# Patient Record
Sex: Female | Born: 1995 | Race: White | Hispanic: No | Marital: Single | Smoking: Never smoker
Health system: Southern US, Community
[De-identification: ages and names within clinical notes are randomized; demographics above are authoritative.]

## PROBLEM LIST (undated history)

## (undated) DIAGNOSIS — T7840XA Allergy, unspecified, initial encounter: Secondary | ICD-10-CM

## (undated) DIAGNOSIS — J45909 Unspecified asthma, uncomplicated: Secondary | ICD-10-CM

## (undated) HISTORY — DX: Allergy, unspecified, initial encounter: T78.40XA

## (undated) HISTORY — DX: Unspecified asthma, uncomplicated: J45.909

---
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
## 2014-03-01 ENCOUNTER — Ambulatory Visit: Payer: BC Managed Care – PPO

## 2014-06-06 ENCOUNTER — Encounter: Payer: Self-pay | Admitting: Family Medicine

## 2014-06-06 ENCOUNTER — Ambulatory Visit: Payer: BC Managed Care – PPO | Admitting: Internal Medicine

## 2014-06-06 ENCOUNTER — Ambulatory Visit (INDEPENDENT_AMBULATORY_CARE_PROVIDER_SITE_OTHER): Payer: BC Managed Care – PPO | Admitting: Family Medicine

## 2014-06-06 VITALS — BP 125/81 | HR 92 | Temp 99.0°F | Ht 66.25 in | Wt 178.2 lb

## 2014-06-06 DIAGNOSIS — J029 Acute pharyngitis, unspecified: Secondary | ICD-10-CM

## 2014-06-06 DIAGNOSIS — J069 Acute upper respiratory infection, unspecified: Secondary | ICD-10-CM

## 2014-06-06 LAB — POCT RAPID STREP A (OFFICE): RAPID STREP A SCREEN: NEGATIVE

## 2014-06-06 NOTE — Progress Notes (Addendum)
   Subjective:    Patient ID: Michelle Parker, female    DOB: Aug 19, 1995, 19 y.o.   MRN: [id number]  Sore Throat  This is a new problem. The current episode started today. The problem has been gradually worsening. Neither side of throat is experiencing more pain than the other. There has been no fever. The pain is at a severity of 4/10. The pain is moderate. Associated symptoms include coughing, ear pain, headaches and swollen glands. Pertinent negatives include no abdominal pain, congestion, diarrhea, ear discharge, plugged ear sensation, neck pain, shortness of breath, trouble swallowing or vomiting. Associated symptoms comments: Sore throat , headache and  Left ear ache  dry cough. She has had no exposure to strep or mono. She has tried nothing for the symptoms.  Headache  Associated symptoms include coughing, ear pain and swollen glands. Pertinent negatives include no abdominal pain, neck pain or vomiting.   Non smoker.  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  Review of Systems  HENT: Positive for ear pain. Negative for congestion, ear discharge and trouble swallowing.   Respiratory: Positive for cough. Negative for shortness of breath.   Gastrointestinal: Negative for vomiting, abdominal pain and diarrhea.  Musculoskeletal: Negative for neck pain.  Neurological: Positive for headaches.       Objective:   Physical Exam  Constitutional: Vital signs are normal. She appears well-developed and well-nourished. She is cooperative.  Non-toxic appearance. She does not appear ill. No distress.  HENT:  Head: Normocephalic.  Right Ear: Hearing, tympanic membrane, external ear and ear canal normal. Tympanic membrane is not erythematous, not retracted and not bulging. No middle ear effusion.  Left Ear: Hearing, external ear and ear canal normal. Tympanic membrane is not erythematous, not retracted and not bulging. A middle ear effusion is present.  Nose: Mucosal edema and rhinorrhea  present. Right sinus exhibits no maxillary sinus tenderness and no frontal sinus tenderness. Left sinus exhibits no maxillary sinus tenderness and no frontal sinus tenderness.  Mouth/Throat: Uvula is midline and mucous membranes are normal. Posterior oropharyngeal erythema present. No oropharyngeal exudate or posterior oropharyngeal edema.  Eyes: Conjunctivae, EOM and lids are normal. Pupils are equal, round, and reactive to light. Lids are everted and swept, no foreign bodies found.  Neck: Trachea normal and normal range of motion. Neck supple. Carotid bruit is not present. No thyroid mass and no thyromegaly present.  Cardiovascular: Normal rate, regular rhythm, S1 normal, S2 normal, normal heart sounds, intact distal pulses and normal pulses.  Exam reveals no gallop and no friction rub.   No murmur heard. Pulmonary/Chest: Effort normal and breath sounds normal. No tachypnea. No respiratory distress. She has no decreased breath sounds. She has no wheezes. She has no rhonchi. She has no rales.  Neurological: She is alert.  Skin: Skin is warm, dry and intact. No rash noted.  Psychiatric: Her speech is normal and behavior is normal. Judgment normal. Her mood appears not anxious. Cognition and memory are normal. She does not exhibit a depressed mood.          Assessment & Plan:

## 2014-06-06 NOTE — Progress Notes (Signed)
Pre visit review using our clinic review tool, if applicable. No additional management support is needed unless otherwise documented below in the visit note. 

## 2014-06-06 NOTE — Addendum Note (Signed)
Addended by: Eliezer Lofts E on: 06/06/2014 04:34 PM   Modules accepted: SmartSet

## 2014-06-06 NOTE — Assessment & Plan Note (Signed)
Neg strep, likely viral. Symptomatic care. Reviewed viral timeline and expected course.

## 2014-06-06 NOTE — Patient Instructions (Signed)
Use nasal saline irrigation or spray to clean out sinus.  Start flonase 2 sprays per nostril daily. If cough increases use mucinex DM. Expect 7-10 days ocurse for viral illness, but if worsening at that point as opposed to better, call.

## 2014-06-20 ENCOUNTER — Telehealth: Payer: Self-pay

## 2014-06-20 NOTE — Telephone Encounter (Signed)
According to med list pt has not had Depo injection since 11/2013. Does pt need 2 neg pregnancy test prior to depo injections if she is sexually active? Pt scheduled 06/21/14 for depo injection.Please advise.

## 2014-06-20 NOTE — Telephone Encounter (Signed)
No she does not need two pregnancy tests.  According to [facility] recommendations, only one pregnancy test suggested before administering depo and and suggest back-up contraception (or abstinence) for seven days after injection.

## 2014-06-21 ENCOUNTER — Ambulatory Visit (INDEPENDENT_AMBULATORY_CARE_PROVIDER_SITE_OTHER): Payer: BC Managed Care – PPO

## 2014-06-21 DIAGNOSIS — Z3042 Encounter for surveillance of injectable contraceptive: Secondary | ICD-10-CM

## 2014-06-21 DIAGNOSIS — Z3200 Encounter for pregnancy test, result unknown: Secondary | ICD-10-CM

## 2014-06-21 LAB — POCT URINE PREGNANCY: PREG TEST UR: NEGATIVE

## 2014-06-21 MED ORDER — MEDROXYPROGESTERONE ACETATE 150 MG/ML IM SUSP
150.0000 mg | Freq: Once | INTRAMUSCULAR | Status: AC
  Administered 2014-06-21: 150 mg via INTRAMUSCULAR

## 2014-06-21 NOTE — Telephone Encounter (Signed)
Pt's pregnancy test was negative; LMP over one year ago; Depo injection given and advised pt to [place] back up contraception for 7 days. Pt voiced understanding.

## 2014-09-06 ENCOUNTER — Ambulatory Visit: Payer: BC Managed Care – PPO

## 2014-09-06 ENCOUNTER — Telehealth: Payer: Self-pay | Admitting: Family Medicine

## 2014-09-06 NOTE — Telephone Encounter (Signed)
Patient did not come in for their appointment today for depo shot.  Please let me know if patient needs to be contacted immediately for follow up or no follow up needed.

## 2014-09-07 NOTE — Telephone Encounter (Signed)
pls reschedule and advise her she must receive pregnancy test before receiving injection

## 2014-09-07 NOTE — Telephone Encounter (Signed)
Please call pt to inform her of this.

## 2014-09-07 NOTE — Telephone Encounter (Signed)
L/m for pt to call back to r/s appt  °

## 2014-09-08 ENCOUNTER — Ambulatory Visit: Payer: BC Managed Care – PPO

## 2014-09-08 NOTE — Telephone Encounter (Signed)
L/m for pt to call bac to r/s appt

## 2014-09-12 ENCOUNTER — Ambulatory Visit (INDEPENDENT_AMBULATORY_CARE_PROVIDER_SITE_OTHER): Payer: BC Managed Care – PPO

## 2014-09-12 DIAGNOSIS — Z3042 Encounter for surveillance of injectable contraceptive: Secondary | ICD-10-CM

## 2014-09-12 MED ORDER — MEDROXYPROGESTERONE ACETATE 150 MG/ML IM SUSP
150.0000 mg | Freq: Once | INTRAMUSCULAR | Status: AC
  Administered 2014-09-12: 150 mg via INTRAMUSCULAR

## 2014-11-29 ENCOUNTER — Ambulatory Visit: Payer: BC Managed Care – PPO

## 2014-11-29 ENCOUNTER — Telehealth: Payer: Self-pay | Admitting: Family Medicine

## 2014-11-29 ENCOUNTER — Telehealth: Payer: Self-pay

## 2014-11-29 NOTE — Telephone Encounter (Signed)
Patient did not come in for their appointment today for depo shot.  Please let me know if patient needs to be contacted immediately for follow up or no follow up needed.

## 2014-11-29 NOTE — Telephone Encounter (Signed)
Pt missed nurse visit for depo this morning and left v/m per DPR for pt to cb and schedule nurse visit between 11/29/14 and 12/12/14.

## 2014-11-29 NOTE — Telephone Encounter (Signed)
Yes please contact pt

## 2014-11-29 NOTE — Telephone Encounter (Signed)
Lm for pt to call back to rs appt between 8/3 and 8/16

## 2014-12-04 NOTE — Telephone Encounter (Signed)
Called pt to rs appt, n/a l/m

## 2014-12-07 NOTE — Telephone Encounter (Signed)
Called to ask pt is she wants to come ion early for depo or wait until cpe, she may have to do additional testing

## 2014-12-11 ENCOUNTER — Encounter: Payer: BC Managed Care – PPO | Admitting: Family Medicine

## 2014-12-14 ENCOUNTER — Ambulatory Visit (INDEPENDENT_AMBULATORY_CARE_PROVIDER_SITE_OTHER): Payer: BC Managed Care – PPO | Admitting: Family Medicine

## 2014-12-14 ENCOUNTER — Encounter: Payer: Self-pay | Admitting: *Deleted

## 2014-12-14 ENCOUNTER — Encounter: Payer: Self-pay | Admitting: Family Medicine

## 2014-12-14 VITALS — BP 112/66 | HR 78 | Temp 98.2°F | Ht 66.25 in | Wt 181.8 lb

## 2014-12-14 DIAGNOSIS — Z3042 Encounter for surveillance of injectable contraceptive: Secondary | ICD-10-CM | POA: Diagnosis not present

## 2014-12-14 DIAGNOSIS — D229 Melanocytic nevi, unspecified: Secondary | ICD-10-CM | POA: Insufficient documentation

## 2014-12-14 DIAGNOSIS — Z01419 Encounter for gynecological examination (general) (routine) without abnormal findings: Secondary | ICD-10-CM | POA: Insufficient documentation

## 2014-12-14 DIAGNOSIS — Z0000 Encounter for general adult medical examination without abnormal findings: Secondary | ICD-10-CM | POA: Diagnosis not present

## 2014-12-14 LAB — CBC WITH DIFFERENTIAL/PLATELET
BASOS PCT: 0.5 % (ref 0.0–3.0)
Basophils Absolute: 0 10*3/uL (ref 0.0–0.1)
EOS PCT: 1 % (ref 0.0–5.0)
Eosinophils Absolute: 0.1 10*3/uL (ref 0.0–0.7)
HCT: 40 % (ref 36.0–46.0)
HEMOGLOBIN: 13.9 g/dL (ref 12.0–15.0)
LYMPHS ABS: 1.6 10*3/uL (ref 0.7–4.0)
Lymphocytes Relative: 31.6 % (ref 12.0–46.0)
MCHC: 34.7 g/dL (ref 30.0–36.0)
MCV: 87.7 fl (ref 78.0–100.0)
MONO ABS: 0.3 10*3/uL (ref 0.1–1.0)
Monocytes Relative: 5.1 % (ref 3.0–12.0)
NEUTROS PCT: 61.8 % (ref 43.0–77.0)
Neutro Abs: 3.1 10*3/uL (ref 1.4–7.7)
Platelets: 239 10*3/uL (ref 150.0–400.0)
RBC: 4.57 Mil/uL (ref 3.87–5.11)
RDW: 12.5 % (ref 11.5–14.6)
WBC: 5.1 10*3/uL (ref 4.5–10.5)

## 2014-12-14 LAB — COMPREHENSIVE METABOLIC PANEL
ALBUMIN: 4.6 g/dL (ref 3.5–5.2)
ALT: 13 U/L (ref 0–35)
AST: 13 U/L (ref 0–37)
Alkaline Phosphatase: 69 U/L (ref 39–117)
BUN: 13 mg/dL (ref 6–23)
CHLORIDE: 106 meq/L (ref 96–112)
CO2: 26 mEq/L (ref 19–32)
Calcium: 9.7 mg/dL (ref 8.4–10.5)
Creatinine, Ser: 0.77 mg/dL (ref 0.40–1.20)
GFR: 101.02 mL/min (ref >60.00)
Glucose, Bld: 99 mg/dL (ref 70–99)
POTASSIUM: 4.4 meq/L (ref 3.5–5.1)
SODIUM: 139 meq/L (ref 135–145)
Total Bilirubin: 0.7 mg/dL (ref 0.2–1.2)
Total Protein: 7.4 g/dL (ref 6.0–8.3)

## 2014-12-14 LAB — LIPID PANEL
CHOLESTEROL: 163 mg/dL (ref 0–200)
HDL: 41.5 mg/dL (ref >39.00)
LDL CALC: 104 mg/dL — AB (ref 0–99)
NonHDL: 121.96
Total CHOL/HDL Ratio: 4
Triglycerides: 91 mg/dL (ref 0.0–149.0)
VLDL: 18.2 mg/dL (ref 0.0–40.0)

## 2014-12-14 LAB — TSH: TSH: 3.6 u[IU]/mL (ref 0.35–5.50)

## 2014-12-14 MED ORDER — MEDROXYPROGESTERONE ACETATE 150 MG/ML IM SUSP
150.0000 mg | Freq: Once | INTRAMUSCULAR | Status: AC
  Administered 2014-12-14: 150 mg via INTRAMUSCULAR

## 2014-12-14 NOTE — Addendum Note (Signed)
Addended by: Lucille Passy on: 12/14/2014 07:55 AM   Modules accepted: Miquel Dunn

## 2014-12-14 NOTE — Assessment & Plan Note (Signed)
Reviewed preventive care protocols, scheduled due services, and updated immunizations Discussed nutrition, exercise, diet, and healthy lifestyle.  Orders Placed This Encounter  Procedures  . CBC with Differential/Platelet  . Comprehensive metabolic panel  . Lipid panel  . TSH     

## 2014-12-14 NOTE — Progress Notes (Signed)
Pre visit review using our clinic review tool, if applicable. No additional management support is needed unless otherwise documented below in the visit note. 

## 2014-12-14 NOTE — Assessment & Plan Note (Signed)
Refer to derm  ?

## 2014-12-14 NOTE — Patient Instructions (Signed)
Great to see you Michelle Parker!  Congratulations on your new apartment and upcoming graduation!  We will call you with your lab results.  Please stop by to see Michelle Parker on we can call you with your dermatology appointment

## 2014-12-14 NOTE — Progress Notes (Addendum)
Subjective:    Patient ID: Michelle Parker, female    DOB: [birth year], 19 y.o.   MRN: [id number]  HPI Very pleasant 19 yo female here for CPX.  Contraception- she is receiving q 3 month IM depo.  She's loves it.  Not having periods.  She is sexually active. Just moved in with her boyfriend of 4 years. She is very happy.  Works is going well.    She has received her entire gardasil series.    Pos FH of hypothyroidism- mom and dad.   Lab Results  Component Value Date   TSH 3.19 12/08/2013    Denies depression- feels very happy at work (works for [employer]) and in her relationships with her family and boyfriend.  Lab Results  Component Value Date   CHOL 153 12/08/2013   HDL 39.60 12/08/2013   LDLCALC 101* 12/08/2013   TRIG 62.0 12/08/2013   CHOLHDL 4 12/08/2013    Patient Active Problem List   Diagnosis Date Noted  . Well woman exam 12/14/2014  . Multiple nevi 12/14/2014  . Encounter for routine gynecological examination 12/08/2013  . Menorrhagia 03/05/2012  . Contraceptive management 02/16/2012   Past Medical History  Diagnosis Date  . Asthma   . Allergy    History reviewed. No pertinent past surgical history. Social History  Substance Use Topics  . Smoking status: Never Smoker   . Smokeless tobacco: Never Used  . Alcohol Use: 0.0 oz/week    0 Standard drinks or equivalent per week     Comment: on occassion   Family History  Problem Relation Age of Onset  . Hypothyroidism Mother   . Hypothyroidism Father    No Known Allergies Current Outpatient Prescriptions on File Prior to Visit  Medication Sig Dispense Refill  . medroxyPROGESTERone (DEPO-PROVERA) 150 MG/ML injection Inject 150 mg into the muscle every 3 (three) months.     No current facility-administered medications on file prior to visit.   The PMH, PSH, Social History, Family History, Medications, and allergies have been reviewed in [facility], and have been updated if relevant.   Review of  Systems  Constitutional: Negative.   HENT: Negative.   Eyes: Negative.   Respiratory: Negative.   Cardiovascular: Negative.   Gastrointestinal: Negative.   Endocrine: Negative.   Genitourinary: Negative.   Musculoskeletal: Negative.   Skin: Negative.   Allergic/Immunologic: Negative.   Neurological: Negative.   Hematological: Negative.   Psychiatric/Behavioral: Negative.   All other systems reviewed and are negative.      Objective:   Physical Exam BP 112/66 mmHg  Pulse 78  Temp(Src) 98.2 F (36.8 C) (Oral)  Ht 5' 6.25" (1.683 m)  Wt 181 lb 12 oz (82.441 kg)  BMI 29.11 kg/m2  SpO2 98%  Wt Readings from Last 3 Encounters:  12/14/14 181 lb 12 oz (82.441 kg)  06/06/14 178 lb 4 oz (80.854 kg)  12/08/13 171 lb 8 oz (77.792 kg) (92 %*, Z = 1.43)   * Growth percentiles are based on CDC 2-20 Years data.     General:  Well-developed,well-nourished,in no acute distress; alert,appropriate and cooperative throughout examination Head:  normocephalic and atraumatic.   Eyes:  vision grossly intact, pupils equal, pupils round, and pupils reactive to light.   Ears:  R ear normal and L ear normal.   Nose:  no external deformity.   Mouth:  good dentition.   Neck:  No deformities, masses, or tenderness noted. Breasts:  No mass, nodules, thickening, tenderness, bulging, retraction,  inflamation, nipple discharge or skin changes noted.   Lungs:  Normal respiratory effort, chest expands symmetrically. Lungs are clear to auscultation, no crackles or wheezes. Heart:  Normal rate and regular rhythm. S1 and S2 normal without gallop, murmur, click, rub or other extra sounds. Abdomen:  Bowel sounds positive,abdomen soft and non-tender without masses, organomegaly or hernias noted. Rectal:  no external abnormalities.   Msk:  No deformity or scoliosis noted of thoracic or lumbar spine.   Extremities:  No clubbing, cyanosis, edema, or deformity noted with normal full range of motion of all  joints.   Neurologic:  alert & oriented X3 and gait normal.   Skin:  Intact without suspicious lesions or rashes Multiple nevi Cervical Nodes:  No lymphadenopathy noted Axillary Nodes:  No palpable lymphadenopathy Psych:  Cognition and judgment appear intact. Alert and cooperative with normal attention span and concentration. No apparent delusions, illusions, hallucinations  Assessment & Plan:

## 2014-12-14 NOTE — Assessment & Plan Note (Signed)
IM depo given today. 

## 2014-12-14 NOTE — Addendum Note (Signed)
Addended by: Modena Nunnery on: 12/14/2014 08:04 AM   Modules accepted: Orders

## 2015-03-01 ENCOUNTER — Encounter: Payer: Self-pay | Admitting: Family Medicine

## 2015-03-01 ENCOUNTER — Ambulatory Visit (INDEPENDENT_AMBULATORY_CARE_PROVIDER_SITE_OTHER): Payer: BC Managed Care – PPO | Admitting: Family Medicine

## 2015-03-01 ENCOUNTER — Ambulatory Visit: Payer: BC Managed Care – PPO

## 2015-03-01 VITALS — BP 108/60 | HR 101 | Temp 98.2°F | Wt 182.5 lb

## 2015-03-01 DIAGNOSIS — Z3042 Encounter for surveillance of injectable contraceptive: Secondary | ICD-10-CM

## 2015-03-01 DIAGNOSIS — R51 Headache: Secondary | ICD-10-CM | POA: Diagnosis not present

## 2015-03-01 DIAGNOSIS — Z23 Encounter for immunization: Secondary | ICD-10-CM | POA: Diagnosis not present

## 2015-03-01 DIAGNOSIS — R519 Headache, unspecified: Secondary | ICD-10-CM | POA: Insufficient documentation

## 2015-03-01 DIAGNOSIS — F4323 Adjustment disorder with mixed anxiety and depressed mood: Secondary | ICD-10-CM | POA: Diagnosis not present

## 2015-03-01 MED ORDER — SERTRALINE HCL 25 MG PO TABS: 25.0000 mg | ORAL_TABLET | Freq: Every day | ORAL | Status: DC

## 2015-03-01 MED ORDER — MEDROXYPROGESTERONE ACETATE 150 MG/ML IM SUSP
150.0000 mg | Freq: Once | INTRAMUSCULAR | Status: AC
  Administered 2015-03-01: 150 mg via INTRAMUSCULAR

## 2015-03-01 NOTE — Assessment & Plan Note (Addendum)
New- >25 minutes spent in face to face time with patient, >50% spent in counselling or coordination of care Discussed tx options- eRx sent for zoloft 25 mg daily. She will call in a few weeks with an update.

## 2015-03-01 NOTE — Progress Notes (Signed)
Subjective:   Patient ID: Michelle Parker, female    DOB: 06-08-1995, 19 y.o.   MRN: [id number]  Michelle Parker is a pleasant 19 y.o. year old female who presents to clinic today with Headache and Contraception  on 03/01/2015  HPI:  Kept a headache journal like I advised- noticed that past 3 weeks, she is having frequent headaches.  She thinks it is all stress related. advil and sleep help to relieve her headaches.  Work is very stressful. She "hates going to work."  Feels anxious and tearful "all the time."  Looking for a new job.   Not sleeping well.  Boyfriend is supportive.  No anhedonia. No SI or HI.  Appetite is ok.  Wt Readings from Last 3 Encounters:  03/01/15 182 lb 8 oz (82.781 kg)  12/14/14 181 lb 12 oz (82.441 kg)  06/06/14 178 lb 4 oz (80.854 kg)   Currently receiving q 3 monthly depo.  She is pleased with this.  Sexually active with her boyfriend only.  Current Outpatient Prescriptions on File Prior to Visit  Medication Sig Dispense Refill  . medroxyPROGESTERone (DEPO-PROVERA) 150 MG/ML injection Inject 150 mg into the muscle every 3 (three) months.     No current facility-administered medications on file prior to visit.    No Known Allergies  Past Medical History  Diagnosis Date  . Asthma   . Allergy     No past surgical history on file.  Family History  Problem Relation Age of Onset  . Hypothyroidism Mother   . Hypothyroidism Father     Social History   Social History  . Marital Status: Single    Spouse Name: N/A  . Number of Children: N/A  . Years of Education: N/A   Occupational History  . Not on file.   Social History Main Topics  . Smoking status: Never Smoker   . Smokeless tobacco: Never Used  . Alcohol Use: 0.0 oz/week    0 Standard drinks or equivalent per week     Comment: on occassion  . Drug Use: No  . Sexual Activity: Not on file   Other Topics Concern  . 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   Sexually active with one partner.   The PMH, PSH, Social History, Family History, Medications, and allergies have been reviewed in [facility], and have been updated if relevant.   Review of Systems  Constitutional: Positive for fatigue.  Eyes: Negative.   Respiratory: Negative.   Cardiovascular: Negative.   Gastrointestinal: Negative.   Endocrine: Negative.   Musculoskeletal: Negative.   Skin: Negative.   Allergic/Immunologic: Negative.   Neurological: Positive for headaches. Negative for dizziness, seizures, facial asymmetry, speech difficulty, light-headedness and numbness.  Hematological: Negative.   Psychiatric/Behavioral: Positive for sleep disturbance and dysphoric mood. Negative for suicidal ideas, hallucinations, self-injury and agitation. The patient is nervous/anxious. The patient is not hyperactive.   All other systems reviewed and are negative.      Objective:    BP 108/60 mmHg  Pulse 101  Temp(Src) 98.2 F (36.8 C) (Oral)  Wt 182 lb 8 oz (82.781 kg)  SpO2 98%   Physical Exam  Constitutional: She is oriented to person, place, and time. She appears well-developed and well-nourished. No distress.  HENT:  Head: Normocephalic.  Eyes: Conjunctivae are normal.  Neck: Normal range of motion.  Cardiovascular: Normal rate.   Pulmonary/Chest: Effort normal.  Musculoskeletal: Normal range of motion.  Neurological: She is alert and oriented to person, place, and time. No cranial nerve deficit.  Skin: Skin is warm and dry.  Psychiatric: She has a normal mood and affect. Her behavior is normal. Judgment and thought content normal.  Nursing note and vitals reviewed.         Assessment & Plan:   Need for influenza vaccination - Plan: Flu Vaccine QUAD 36+ mos PF IM (Fluarix & Fluzone Quad PF)  Encounter for surveillance of injectable contraceptive - Plan: medroxyPROGESTERone (DEPO-PROVERA) injection 150 mg  Frequent headaches  Adjustment disorder with  mixed anxiety and depressed mood No Follow-up on file.

## 2015-03-01 NOTE — Assessment & Plan Note (Signed)
IM depo given today. 

## 2015-03-01 NOTE — Patient Instructions (Signed)
Great to see you.  We are starting zoloft 25 mg daily. Please call with an update in a few weeks.

## 2015-03-01 NOTE — Assessment & Plan Note (Signed)
Deteriorated. Likely due to acute stressors. Follow up in 1 month. See above. Continue NSAIDs prn for abortive therapy.

## 2015-03-01 NOTE — Progress Notes (Signed)
Pre visit review using our clinic review tool, if applicable. No additional management support is needed unless otherwise documented below in the visit note. 

## 2015-06-27 ENCOUNTER — Ambulatory Visit (INDEPENDENT_AMBULATORY_CARE_PROVIDER_SITE_OTHER): Payer: BC Managed Care – PPO | Admitting: *Deleted

## 2015-06-27 ENCOUNTER — Ambulatory Visit (INDEPENDENT_AMBULATORY_CARE_PROVIDER_SITE_OTHER): Payer: BC Managed Care – PPO | Admitting: Internal Medicine

## 2015-06-27 ENCOUNTER — Encounter: Payer: Self-pay | Admitting: Internal Medicine

## 2015-06-27 VITALS — BP 106/80 | HR 110 | Temp 99.3°F | Wt 195.0 lb

## 2015-06-27 DIAGNOSIS — Z308 Encounter for other contraceptive management: Secondary | ICD-10-CM

## 2015-06-27 DIAGNOSIS — R52 Pain, unspecified: Secondary | ICD-10-CM | POA: Diagnosis not present

## 2015-06-27 DIAGNOSIS — J111 Influenza due to unidentified influenza virus with other respiratory manifestations: Secondary | ICD-10-CM | POA: Diagnosis not present

## 2015-06-27 DIAGNOSIS — R509 Fever, unspecified: Secondary | ICD-10-CM

## 2015-06-27 LAB — POCT INFLUENZA A/B
INFLUENZA A, POC: NEGATIVE
Influenza B, POC: POSITIVE — AB

## 2015-06-27 LAB — POCT URINE PREGNANCY: PREG TEST UR: NEGATIVE

## 2015-06-27 LAB — POCT RAPID STREP A (OFFICE): Rapid Strep A Screen: NEGATIVE

## 2015-06-27 MED ORDER — OSELTAMIVIR PHOSPHATE 75 MG PO CAPS: 75.0000 mg | ORAL_CAPSULE | Freq: Two times a day (BID) | ORAL | Status: DC

## 2015-06-27 MED ORDER — MEDROXYPROGESTERONE ACETATE 150 MG/ML IM SUSP
150.0000 mg | Freq: Once | INTRAMUSCULAR | Status: AC
  Administered 2015-06-27: 150 mg via INTRAMUSCULAR

## 2015-06-27 NOTE — Patient Instructions (Addendum)

## 2015-06-27 NOTE — Progress Notes (Signed)
HPI  Pt presents to the clinic today with c/o ear pain and sore throat. This started 2 days ago. She denies loss of hearing. She is having some difficulty swallowing, runny nose, nasal congestion and cough. She reports fever and body aches. She denies shortness of breath. She has taken Tylenol with minimal relief. She has a history of allergies and breathing problems. She has had sick contacts. She did get her flu shot.  Review of Systems    Past Medical History  Diagnosis Date  . Asthma   . Allergy     Family History  Problem Relation Age of Onset  . Hypothyroidism Mother   . Hypothyroidism Father     Social History   Social History  . Marital Status: Single    Spouse Name: N/A  . Number of Children: N/A  . Years of Education: N/A   Occupational History  . Not on file.   Social History Main Topics  . Smoking status: Never Smoker   . Smokeless tobacco: Never Used  . Alcohol Use: 0.0 oz/week    0 Standard drinks or equivalent per week     Comment: on occassion  . Drug Use: No  . Sexual Activity: Not on file   Other Topics Concern  . Not on file   Social History Narrative   Works for [employer] active with one partner.    No Known Allergies   Constitutional: Positive headache, fatigue and fever. Denies abrupt weight changes.  HEENT:  Positive ear pain, nasal congestion, runny nose and sore throat. Denies eye redness, ringing in the ears, wax buildup or bloody nose. Respiratory: Positive cough. Denies difficulty breathing or shortness of breath.  Cardiovascular: Denies chest pain, chest tightness, palpitations or swelling in the hands or feet.   No other specific complaints in a complete review of systems (except as listed in HPI above).  Objective:  BP 106/80 mmHg  Pulse 110  Temp(Src) 99.3 F (37.4 C) (Oral)  Wt 195 lb (88.451 kg)  SpO2 98%   General: Appears her stated age, ill appearing in NAD. HEENT: Head: normal shape and size,  no sinus tenderness noted; Eyes: sclera white, no icterus, conjunctiva pink; Ears: Tm's pink but intact, normal light reflex, + serous effusion on the right; Nose: mucosa boggy and moist, septum midline; Throat/Mouth: Teeth present, mucosa erythematous and moist, no exudate noted, no lesions or ulcerations noted.  Neck:  No adenopathy noted.  Cardiovascular: Tachycardic with normal rhythm. S1,S2 noted.  No murmur, rubs or gallops noted.  Pulmonary/Chest: Normal effort and positive vesicular breath sounds. No respiratory distress. No wheezes, rales or ronchi noted.      Assessment & Plan:  Influenza:  Rapid flu: positive RST: negative Ibuprofen as needed for fever/inflammation Salt water gargles for sore throat Flonase BID x 3 days then daily thereafter Mucinex Q12H as needed for congestion Delsym as needed for cough eRx for Tamiflu 75 mg BID x 5 days  RTC as needed or if symptoms persist.

## 2015-06-27 NOTE — Addendum Note (Signed)
Addended by: Lurlean Nanny on: 06/27/2015 03:20 PM   Modules accepted: Orders

## 2015-08-28 ENCOUNTER — Ambulatory Visit (INDEPENDENT_AMBULATORY_CARE_PROVIDER_SITE_OTHER): Payer: BC Managed Care – PPO | Admitting: Internal Medicine

## 2015-08-28 ENCOUNTER — Encounter: Payer: Self-pay | Admitting: Internal Medicine

## 2015-08-28 VITALS — BP 114/70 | HR 78 | Temp 99.2°F | Wt 198.8 lb

## 2015-08-28 DIAGNOSIS — J309 Allergic rhinitis, unspecified: Secondary | ICD-10-CM

## 2015-08-28 DIAGNOSIS — R059 Cough, unspecified: Secondary | ICD-10-CM

## 2015-08-28 DIAGNOSIS — R05 Cough: Secondary | ICD-10-CM | POA: Diagnosis not present

## 2015-08-28 MED ORDER — HYDROCODONE-HOMATROPINE 5-1.5 MG/5ML PO SYRP: 5.0000 mL | ORAL_SOLUTION | Freq: Three times a day (TID) | ORAL | Status: DC | PRN

## 2015-08-28 MED ORDER — METHYLPREDNISOLONE ACETATE 80 MG/ML IJ SUSP
80.0000 mg | Freq: Once | INTRAMUSCULAR | Status: AC
  Administered 2015-08-28: 80 mg via INTRAMUSCULAR

## 2015-08-28 NOTE — Patient Instructions (Signed)
Allergic Rhinitis Allergic rhinitis is when the mucous membranes in the nose respond to allergens. Allergens are particles in the air that cause your body to have an allergic reaction. This causes you to release allergic antibodies. Through a chain of events, these eventually cause you to release histamine into the blood stream. Although meant to protect the body, it is this release of histamine that causes your discomfort, such as frequent sneezing, congestion, and an itchy, runny nose.  CAUSES Seasonal allergic rhinitis (hay fever) is caused by pollen allergens that may come from grasses, trees, and weeds. Year-round allergic rhinitis (perennial allergic rhinitis) is caused by allergens such as house dust mites, pet dander, and mold spores. SYMPTOMS  Nasal stuffiness (congestion).  Itchy, runny nose with sneezing and tearing of the eyes. DIAGNOSIS Your health care provider can help you determine the allergen or allergens that trigger your symptoms. If you and your health care provider are unable to determine the allergen, skin or blood testing may be used. Your health care provider will diagnose your condition after taking your health history and performing a physical exam. Your health care provider may assess you for other related conditions, such as asthma, pink eye, or an ear infection. TREATMENT Allergic rhinitis does not have a cure, but it can be controlled by:  Medicines that block allergy symptoms. These may include allergy shots, nasal sprays, and oral antihistamines.  Avoiding the allergen. Hay fever may often be treated with antihistamines in pill or nasal spray forms. Antihistamines block the effects of histamine. There are over-the-counter medicines that may help with nasal congestion and swelling around the eyes. Check with your health care provider before taking or giving this medicine. If avoiding the allergen or the medicine prescribed do not work, there are many new medicines  your health care provider can prescribe. Stronger medicine may be used if initial measures are ineffective. Desensitizing injections can be used if medicine and avoidance does not work. Desensitization is when a patient is given ongoing shots until the body becomes less sensitive to the allergen. Make sure you follow up with your health care provider if problems continue. HOME CARE INSTRUCTIONS It is not possible to completely avoid allergens, but you can reduce your symptoms by taking steps to limit your exposure to them. It helps to know exactly what you are allergic to so that you can avoid your specific triggers. SEEK MEDICAL CARE IF:  You have a fever.  You develop a cough that does not stop easily (persistent).  You have shortness of breath.  You start wheezing.  Symptoms interfere with normal daily activities.   This information is not intended to replace advice given to you by your health care provider. Make sure you discuss any questions you have with your health care provider.   Document Released: 01/07/2001 Document Revised: 05/05/2014 Document Reviewed: 12/20/2012 Elsevier Interactive Patient Education 2016 [employer]  

## 2015-08-28 NOTE — Progress Notes (Signed)
Pre visit review using our clinic review tool, if applicable. No additional management support is needed unless otherwise documented below in the visit note. 

## 2015-08-28 NOTE — Progress Notes (Signed)
HPI  Pt presents to the clinic today with c/o sore throat, cough and fatigue. This started 1 week ago. She is not blowing anything out of her nose. The cough is productive of  Green/brown mucous only in the morning. She denies fevers, chills or body ache. She has tried Human resources officer without relief. She does not have a h/o seasonal allergies, but she moved into a house 3 wks ago which has mold. She has not had sick contact.  Review of Systems    Past Medical History  Diagnosis Date  . Asthma   . Allergy     Family History  Problem Relation Age of Onset  . Hypothyroidism Mother   . Hypothyroidism Father     Social History   Social History  . Marital Status: Single    Spouse Name: N/A  . Number of Children: N/A  . Years of Education: N/A   Occupational History  . Not on file.   Social History Main Topics  . Smoking status: Never Smoker   . Smokeless tobacco: Never Used  . Alcohol Use: 0.0 oz/week    0 Standard drinks or equivalent per week     Comment: on occassion  . Drug Use: No  . Sexual Activity: Not on file   Other Topics Concern  . Not on file   Social History Narrative   Works for [employer] active with one partner.    No Known Allergies   Constitutional: Positive for fatigue. Denies headache, fever, and abrupt weight changes.  HEENT:  Positive for sore throat and occasional ear pain/itchiness. Denies eye pain, facial pain, and nasal congestion. Denies eye redness, ringing in the ears, wax buildup, runny nose or bloody nose. Respiratory: Positive productive cough. Denies difficulty breathing or shortness of breath.  Cardiovascular: Denies chest pain, chest tightness, palpitations or swelling in the hands or feet.   No other specific complaints in a complete review of systems (except as listed in HPI above).  Objective:  BP 114/70 mmHg  Pulse 78  Temp(Src) 99.2 F (37.3 C) (Oral)  Wt 198 lb 12 oz (90.152 kg)  SpO2 99%   General:  Appears her stated age, in NAD. HEENT: Head: normal shape and size, no sinus tenderness noted; Eyes: sclera white, no icterus, conjunctiva pink; Ears: Tm's gray and intact, normal light reflex; Nose: mucosa boggy and moist, erythematous and inflamed (left>right) septum midline; Throat/Mouth: + PND. Teeth present, mucosa erythematous and moist, no exudate noted, no lesions or ulcerations noted.  Neck:  No adenopathy noted.  Cardiovascular: Normal rate and rhythm. S1,S2 noted.  No murmur, rubs or gallops noted.  Pulmonary/Chest: Normal effort and positive vesicular breath sounds. No respiratory distress. No wheezes, rales or ronchi noted.      Assessment & Plan:   Allergic rhinitis:  80 mg Depo IM today Continue Allegra Start Flonase  RX for Hycodan for cough   RTC as needed or if symptoms persist.

## 2015-11-02 ENCOUNTER — Ambulatory Visit: Payer: BC Managed Care – PPO

## 2015-12-27 ENCOUNTER — Ambulatory Visit (INDEPENDENT_AMBULATORY_CARE_PROVIDER_SITE_OTHER): Payer: BC Managed Care – PPO | Admitting: Family Medicine

## 2015-12-27 ENCOUNTER — Encounter: Payer: Self-pay | Admitting: Family Medicine

## 2015-12-27 ENCOUNTER — Other Ambulatory Visit (HOSPITAL_COMMUNITY)
Admission: RE | Admit: 2015-12-27 | Discharge: 2015-12-27 | Disposition: A | Discharge status: Home / Self Care | Payer: BC Managed Care – PPO | Source: Ambulatory Visit | Attending: Family Medicine | Admitting: Family Medicine

## 2015-12-27 VITALS — BP 114/66 | HR 93 | Temp 98.3°F | Ht 66.25 in | Wt 200.2 lb

## 2015-12-27 DIAGNOSIS — Z01419 Encounter for gynecological examination (general) (routine) without abnormal findings: Secondary | ICD-10-CM | POA: Insufficient documentation

## 2015-12-27 DIAGNOSIS — Z0000 Encounter for general adult medical examination without abnormal findings: Secondary | ICD-10-CM | POA: Diagnosis not present

## 2015-12-27 DIAGNOSIS — Z3042 Encounter for surveillance of injectable contraceptive: Secondary | ICD-10-CM

## 2015-12-27 DIAGNOSIS — Z1151 Encounter for screening for human papillomavirus (HPV): Secondary | ICD-10-CM | POA: Diagnosis not present

## 2015-12-27 DIAGNOSIS — Z23 Encounter for immunization: Secondary | ICD-10-CM | POA: Diagnosis not present

## 2015-12-27 LAB — LIPID PANEL
CHOL/HDL RATIO: 4
CHOLESTEROL: 160 mg/dL (ref 0–200)
HDL: 37.5 mg/dL — ABNORMAL LOW (ref >39.00)
LDL CALC: 102 mg/dL — AB (ref 0–99)
NonHDL: 122.13
Triglycerides: 103 mg/dL (ref 0.0–149.0)
VLDL: 20.6 mg/dL (ref 0.0–40.0)

## 2015-12-27 LAB — TSH: TSH: 2.81 u[IU]/mL (ref 0.35–4.50)

## 2015-12-27 LAB — CBC WITH DIFFERENTIAL/PLATELET
BASOS PCT: 0.4 % (ref 0.0–3.0)
Basophils Absolute: 0 10*3/uL (ref 0.0–0.1)
EOS ABS: 0.2 10*3/uL (ref 0.0–0.7)
EOS PCT: 3 % (ref 0.0–5.0)
HCT: 39.4 % (ref 36.0–46.0)
Hemoglobin: 13.8 g/dL (ref 12.0–15.0)
LYMPHS ABS: 1.7 10*3/uL (ref 0.7–4.0)
Lymphocytes Relative: 25.8 % (ref 12.0–46.0)
MCHC: 35.1 g/dL (ref 30.0–36.0)
MCV: 87.1 fl (ref 78.0–100.0)
MONO ABS: 0.4 10*3/uL (ref 0.1–1.0)
Monocytes Relative: 6.3 % (ref 3.0–12.0)
NEUTROS ABS: 4.3 10*3/uL (ref 1.4–7.7)
NEUTROS PCT: 64.5 % (ref 43.0–77.0)
PLATELETS: 240 10*3/uL (ref 150.0–400.0)
RBC: 4.53 Mil/uL (ref 3.87–5.11)
RDW: 12.5 % (ref 11.5–15.5)
WBC: 6.7 10*3/uL (ref 4.0–10.5)

## 2015-12-27 LAB — COMPREHENSIVE METABOLIC PANEL
ALBUMIN: 4.4 g/dL (ref 3.5–5.2)
ALT: 35 U/L (ref 0–35)
AST: 70 U/L — ABNORMAL HIGH (ref 0–37)
Alkaline Phosphatase: 74 U/L (ref 39–117)
BUN: 14 mg/dL (ref 6–23)
CHLORIDE: 105 meq/L (ref 96–112)
CO2: 26 meq/L (ref 19–32)
Calcium: 9.1 mg/dL (ref 8.4–10.5)
Creatinine, Ser: 0.72 mg/dL (ref 0.40–1.20)
GFR: 108.08 mL/min (ref >60.00)
Glucose, Bld: 91 mg/dL (ref 70–99)
POTASSIUM: 4.4 meq/L (ref 3.5–5.1)
SODIUM: 138 meq/L (ref 135–145)
Total Bilirubin: 0.6 mg/dL (ref 0.2–1.2)
Total Protein: 7.4 g/dL (ref 6.0–8.3)

## 2015-12-27 MED ORDER — NORETHIN ACE-ETH ESTRAD-FE 1-20 MG-MCG PO TABS: 1.0000 | ORAL_TABLET | Freq: Every day | ORAL | 11 refills | Status: DC

## 2015-12-27 NOTE — Patient Instructions (Signed)
Great to see you. We will call you with your results from today and you can view them online. 

## 2015-12-27 NOTE — Progress Notes (Signed)
Pre visit review using our clinic review tool, if applicable. No additional management support is needed unless otherwise documented below in the visit note. 

## 2015-12-27 NOTE — Assessment & Plan Note (Signed)
Education given regarding options for contraception, including oral contraceptives   eRx sent for Loestrin.

## 2015-12-27 NOTE — Assessment & Plan Note (Signed)
Reviewed preventive care protocols, scheduled due services, and updated immunizations Discussed nutrition, exercise, diet, and healthy lifestyle.  Pap smear done today.  Orders Placed This Encounter  Procedures  . Flu Vaccine QUAD 36+ mos PF IM (Fluarix & Fluzone Quad PF)  . CBC with Differential/Platelet  . Comprehensive metabolic panel  . Lipid panel  . TSH

## 2015-12-27 NOTE — Progress Notes (Signed)
Subjective:   Patient ID: Michelle Parker, female    DOB: Nov 28, 1995, 20 y.o.   MRN: [id number]  Michelle Parker is a pleasant 20 y.o. year old female who presents to clinic today with Annual Exam (with pap)  on 12/27/2015  HPI:  Has never had a pap smear.  No family history of cervical cancer.  Sexually active with her boyfriend only.  Currently receiving Depo provera every 3 months for birth control.  She wants to go back to [facility] since it is hard to come for an injection every 3 months.  Also she is concerned about decreased sex drive with it.  Wt Readings from Last 3 Encounters:  12/27/15 200 lb 4 oz (90.8 kg)  08/28/15 198 lb 12 oz (90.2 kg)  06/27/15 195 lb (88.5 kg)   Current Outpatient Prescriptions on File Prior to Visit  Medication Sig Dispense Refill  . medroxyPROGESTERone (DEPO-PROVERA) 150 MG/ML injection Inject 150 mg into the muscle every 3 (three) months.     No current facility-administered medications on file prior to visit.     No Known Allergies  Past Medical History:  Diagnosis Date  . Allergy   . Asthma     No past surgical history on file.  Family History  Problem Relation Age of Onset  . Hypothyroidism Mother   . Hypothyroidism Father     Social History   Social History  . Marital status: Single    Spouse name: N/A  . Number of children: N/A  . Years of education: N/A   Occupational History  . Not on file.   Social History Main Topics  . Smoking status: Never Smoker  . Smokeless tobacco: Never Used  . Alcohol use 0.0 oz/week     Comment: on occassion  . Drug use: No  . Sexual activity: Not on file   Other Topics Concern  . Not on file   Social History Narrative   Works for [employer] active with one partner.   The PMH, PSH, Social History, Family History, Medications, and allergies have been reviewed in [facility], and have been updated if relevant.   Review of Systems  Constitutional: Negative.   HENT:  Negative.   Respiratory: Negative.   Cardiovascular: Negative.   Gastrointestinal: Negative.   Endocrine: Negative.   Genitourinary: Negative.   Musculoskeletal: Negative.   Allergic/Immunologic: Negative.   Neurological: Negative.   Hematological: Negative.   Psychiatric/Behavioral: Negative.   All other systems reviewed and are negative.      Objective:    BP 114/66   Pulse 93   Temp 98.3 F (36.8 C) (Oral)   Ht 5' 6.25" (1.683 m)   Wt 200 lb 4 oz (90.8 kg)   SpO2 99%   BMI 32.08 kg/m    Physical Exam    General:  Well-developed,well-nourished,in no acute distress; alert,appropriate and cooperative throughout examination Head:  normocephalic and atraumatic.   Eyes:  vision grossly intact, pupils equal, pupils round, and pupils reactive to light.   Ears:  R ear normal and L ear normal.   Nose:  no external deformity.   Mouth:  good dentition.   Neck:  No deformities, masses, or tenderness noted. Breasts:  No mass, nodules, thickening, tenderness, bulging, retraction, inflamation, nipple discharge or skin changes noted.   Lungs:  Normal respiratory effort, chest expands symmetrically. Lungs are clear to auscultation, no crackles or wheezes. Heart:  Normal rate and regular rhythm. S1 and S2 normal  without gallop, murmur, click, rub or other extra sounds. Abdomen:  Bowel sounds positive,abdomen soft and non-tender without masses, organomegaly or hernias noted. Rectal:  no external abnormalities.   Genitalia:  Pelvic Exam:        External: normal female genitalia without lesions or masses        Vagina: normal without lesions or masses        Cervix: normal without lesions or masses        Adnexa: normal bimanual exam without masses or fullness        Uterus: normal by palpation        Pap smear: performed Msk:  No deformity or scoliosis noted of thoracic or lumbar spine.   Extremities:  No clubbing, cyanosis, edema, or deformity noted with normal full range of motion  of all joints.   Neurologic:  alert & oriented X3 and gait normal.   Skin:  Intact without suspicious lesions or rashes Cervical Nodes:  No lymphadenopathy noted Axillary Nodes:  No palpable lymphadenopathy Psych:  Cognition and judgment appear intact. Alert and cooperative with normal attention span and concentration. No apparent delusions, illusions, hallucinations      Assessment & Plan:   Need for influenza vaccination - Plan: Flu Vaccine QUAD 36+ mos PF IM (Fluarix & Fluzone Quad PF)  Encounter for surveillance of injectable contraceptive  Well woman exam No Follow-up on file.

## 2015-12-27 NOTE — Addendum Note (Signed)
Addended by: Modena Nunnery on: 12/27/2015 08:02 AM   Modules accepted: Orders

## 2015-12-28 ENCOUNTER — Encounter: Payer: Self-pay | Admitting: *Deleted

## 2015-12-28 LAB — CYTOLOGY - PAP

## 2016-01-02 ENCOUNTER — Encounter: Payer: Self-pay | Admitting: *Deleted

## 2016-01-10 ENCOUNTER — Telehealth: Payer: Self-pay

## 2016-01-10 NOTE — Telephone Encounter (Signed)
Pt request BC transferred to pharmacy in [place]. pthas moved. Spoke with Ronalee Belts at [facility] and rx transferred on 01/02/16 to [place]. Pt will ck with [facility].

## 2016-02-04 ENCOUNTER — Other Ambulatory Visit: Payer: Self-pay | Admitting: Family Medicine

## 2016-12-31 ENCOUNTER — Ambulatory Visit (INDEPENDENT_AMBULATORY_CARE_PROVIDER_SITE_OTHER): Payer: 59 | Admitting: Family Medicine

## 2016-12-31 ENCOUNTER — Encounter: Payer: Self-pay | Admitting: Family Medicine

## 2016-12-31 ENCOUNTER — Other Ambulatory Visit (HOSPITAL_COMMUNITY)
Admission: RE | Admit: 2016-12-31 | Discharge: 2016-12-31 | Disposition: A | Discharge status: Home / Self Care | Payer: 59 | Source: Ambulatory Visit | Attending: Family Medicine | Admitting: Family Medicine

## 2016-12-31 VITALS — BP 104/82 | HR 82 | Temp 98.4°F | Resp 16 | Ht 66.25 in | Wt 189.8 lb

## 2016-12-31 DIAGNOSIS — Z124 Encounter for screening for malignant neoplasm of cervix: Secondary | ICD-10-CM

## 2016-12-31 DIAGNOSIS — Z3041 Encounter for surveillance of contraceptive pills: Secondary | ICD-10-CM | POA: Diagnosis not present

## 2016-12-31 DIAGNOSIS — Z01419 Encounter for gynecological examination (general) (routine) without abnormal findings: Secondary | ICD-10-CM

## 2016-12-31 DIAGNOSIS — Z23 Encounter for immunization: Secondary | ICD-10-CM

## 2016-12-31 DIAGNOSIS — Z0000 Encounter for general adult medical examination without abnormal findings: Secondary | ICD-10-CM | POA: Diagnosis not present

## 2016-12-31 LAB — COMPREHENSIVE METABOLIC PANEL
ALK PHOS: 75 U/L (ref 39–117)
ALT: 13 U/L (ref 0–35)
AST: 12 U/L (ref 0–37)
Albumin: 4.8 g/dL (ref 3.5–5.2)
BILIRUBIN TOTAL: 0.7 mg/dL (ref 0.2–1.2)
BUN: 8 mg/dL (ref 6–23)
CO2: 26 meq/L (ref 19–32)
CREATININE: 0.71 mg/dL (ref 0.40–1.20)
Calcium: 10 mg/dL (ref 8.4–10.5)
Chloride: 102 mEq/L (ref 96–112)
GFR: 108.82 mL/min (ref >60.00)
GLUCOSE: 83 mg/dL (ref 70–99)
Potassium: 4.1 mEq/L (ref 3.5–5.1)
SODIUM: 138 meq/L (ref 135–145)
TOTAL PROTEIN: 7.7 g/dL (ref 6.0–8.3)

## 2016-12-31 LAB — LIPID PANEL
Cholesterol: 203 mg/dL — ABNORMAL HIGH (ref 0–200)
HDL: 44.1 mg/dL (ref >39.00)
LDL Cholesterol: 141 mg/dL — ABNORMAL HIGH (ref 0–99)
NONHDL: 159.2
TRIGLYCERIDES: 92 mg/dL (ref 0.0–149.0)
Total CHOL/HDL Ratio: 5
VLDL: 18.4 mg/dL (ref 0.0–40.0)

## 2016-12-31 LAB — CBC WITH DIFFERENTIAL/PLATELET
BASOS ABS: 0 10*3/uL (ref 0.0–0.1)
Basophils Relative: 0.8 % (ref 0.0–3.0)
EOS ABS: 0 10*3/uL (ref 0.0–0.7)
Eosinophils Relative: 0.5 % (ref 0.0–5.0)
HCT: 41 % (ref 36.0–46.0)
Hemoglobin: 13.9 g/dL (ref 12.0–15.0)
LYMPHS ABS: 1.9 10*3/uL (ref 0.7–4.0)
Lymphocytes Relative: 32.2 % (ref 12.0–46.0)
MCHC: 33.8 g/dL (ref 30.0–36.0)
MCV: 88.7 fl (ref 78.0–100.0)
MONOS PCT: 6.4 % (ref 3.0–12.0)
Monocytes Absolute: 0.4 10*3/uL (ref 0.1–1.0)
NEUTROS ABS: 3.5 10*3/uL (ref 1.4–7.7)
NEUTROS PCT: 60.1 % (ref 43.0–77.0)
PLATELETS: 278 10*3/uL (ref 150.0–400.0)
RBC: 4.62 Mil/uL (ref 3.87–5.11)
RDW: 12.3 % (ref 11.5–15.5)
WBC: 5.8 10*3/uL (ref 4.0–10.5)

## 2016-12-31 LAB — TSH: TSH: 2.42 u[IU]/mL (ref 0.35–4.50)

## 2016-12-31 MED ORDER — DROSPIRENONE-ETHINYL ESTRADIOL 3-0.02 MG PO TABS: 1.0000 | ORAL_TABLET | Freq: Every day | ORAL | 11 refills | Status: AC

## 2016-12-31 NOTE — Progress Notes (Signed)
Subjective:   Patient ID: Michelle Parker, female    DOB: 09/08/1995, 21 y.o.   MRN: [id number]  Michelle Parker is a pleasant 21 y.o. year old female who presents to clinic today with Annual Exam  on 12/31/2016  HPI:  First pap smear done by me on 12/27/15- normal.  No family history of cervical cancer.  Sexually active with her husband only. Not pleased with Loestrin- periods are heavy and cramping severe. Lab Results  Component Value Date   CHOL 203 (H) 12/31/2016   HDL 44.10 12/31/2016   LDLCALC 141 (H) 12/31/2016   TRIG 92.0 12/31/2016   CHOLHDL 5 12/31/2016   Lab Results  Component Value Date   CREATININE 0.71 12/31/2016   Lab Results  Component Value Date   NA 138 12/31/2016   K 4.1 12/31/2016   CL 102 12/31/2016   CO2 26 12/31/2016   Lab Results  Component Value Date   WBC 5.8 12/31/2016   HGB 13.9 12/31/2016   HCT 41.0 12/31/2016   MCV 88.7 12/31/2016   PLT 278.0 12/31/2016   Lab Results  Component Value Date   TSH 2.42 12/31/2016    Wt Readings from Last 3 Encounters:  12/31/16 189 lb 12.8 oz (86.1 kg)  12/27/15 200 lb 4 oz (90.8 kg)  08/28/15 198 lb 12 oz (90.2 kg)   No current outpatient prescriptions on file prior to visit.   No current facility-administered medications on file prior to visit.     No Known Allergies  Past Medical History:  Diagnosis Date  . Allergy   . Asthma     History reviewed. No pertinent surgical history.  Family History  Problem Relation Age of Onset  . Hypothyroidism Mother   . Hypothyroidism Father     Social History   Social History  . Marital status: Single    Spouse name: N/A  . Number of children: N/A  . Years of education: N/A   Occupational History  . Not on file.   Social History Main Topics  . Smoking status: Never Smoker  . Smokeless tobacco: Never Used  . Alcohol use 0.0 oz/week     Comment: on occassion  . Drug use: No  . Sexual activity: Not on file   Other Topics Concern  . Not  on file   Social History Narrative   Works for [employer] active with one partner.   The PMH, PSH, Social History, Family History, Medications, and allergies have been reviewed in [facility], and have been updated if relevant.   Review of Systems  Constitutional: Negative.   HENT: Negative.   Respiratory: Negative.   Cardiovascular: Negative.   Gastrointestinal: Negative.   Endocrine: Negative.   Genitourinary: Negative.   Musculoskeletal: Negative.   Allergic/Immunologic: Negative.   Neurological: Negative.   Hematological: Negative.   Psychiatric/Behavioral: Negative.   All other systems reviewed and are negative.      Objective:    BP 104/82   Pulse 82   Temp 98.4 F (36.9 C) (Oral)   Resp 16   Ht 5' 6.25" (1.683 m)   Wt 189 lb 12.8 oz (86.1 kg)   LMP 12/15/2016   SpO2 98%   BMI 30.40 kg/m    Physical Exam    General:  Well-developed,well-nourished,in no acute distress; alert,appropriate and cooperative throughout examination Head:  normocephalic and atraumatic.   Eyes:  vision grossly intact, pupils equal, pupils round, and pupils reactive to light.  Ears:  R ear normal and L ear normal.   Nose:  no external deformity.   Mouth:  good dentition.   Neck:  No deformities, masses, or tenderness noted. Breasts:  No mass, nodules, thickening, tenderness, bulging, retraction, inflamation, nipple discharge or skin changes noted.   Lungs:  Normal respiratory effort, chest expands symmetrically. Lungs are clear to auscultation, no crackles or wheezes. Heart:  Normal rate and regular rhythm. S1 and S2 normal without gallop, murmur, click, rub or other extra sounds. Abdomen:  Bowel sounds positive,abdomen soft and non-tender without masses, organomegaly or hernias noted. Rectal:  no external abnormalities.   Genitalia:  Pelvic Exam:        External: normal female genitalia without lesions or masses        Vagina: normal without lesions or masses         Cervix: normal without lesions or masses        Adnexa: normal bimanual exam without masses or fullness        Uterus: normal by palpation        Pap smear: performed Msk:  No deformity or scoliosis noted of thoracic or lumbar spine.   Extremities:  No clubbing, cyanosis, edema, or deformity noted with normal full range of motion of all joints.   Neurologic:  alert & oriented X3 and gait normal.   Skin:  Intact without suspicious lesions or rashes Cervical Nodes:  No lymphadenopathy noted Axillary Nodes:  No palpable lymphadenopathy Psych:  Cognition and judgment appear intact. Alert and cooperative with normal attention span and concentration. No apparent delusions, illusions, hallucinations      Assessment & Plan:   Well woman exam - Plan: CBC with Differential/Platelet, Comprehensive metabolic panel, Lipid panel, TSH  Encounter for surveillance of contraceptive pills  Need for immunization against influenza - Plan: Flu Vaccine QUAD 6+ mos PF IM (Fluarix Quad PF)  Cervical cancer screening - Plan: Cytology - PAP No Follow-up on file.

## 2017-01-01 NOTE — Assessment & Plan Note (Signed)
Having spotting and cramping with current ocps. D/c loestrin. eRx sent for yaz. Call or return to clinic prn if these symptoms worsen or fail to improve as anticipated. The patient indicates understanding of these issues and agrees with the plan.

## 2017-01-01 NOTE — Assessment & Plan Note (Signed)
Reviewed preventive care protocols, scheduled due services, and updated immunizations Discussed nutrition, exercise, diet, and healthy lifestyle.  

## 2017-01-02 LAB — CYTOLOGY - PAP
DIAGNOSIS: NEGATIVE
HPV (WINDOPATH): NOT DETECTED

## 2017-06-04 ENCOUNTER — Ambulatory Visit (INDEPENDENT_AMBULATORY_CARE_PROVIDER_SITE_OTHER): Payer: Managed Care, Other (non HMO) | Admitting: Family Medicine

## 2017-06-04 ENCOUNTER — Encounter: Payer: Self-pay | Admitting: Family Medicine

## 2017-06-04 VITALS — BP 118/72 | HR 103 | Temp 98.8°F | Ht 66.25 in | Wt 193.2 lb

## 2017-06-04 DIAGNOSIS — B349 Viral infection, unspecified: Secondary | ICD-10-CM | POA: Diagnosis not present

## 2017-06-04 LAB — POC INFLUENZA A&B (BINAX/QUICKVUE)
INFLUENZA B, POC: NEGATIVE
Influenza A, POC: NEGATIVE

## 2017-06-04 NOTE — Progress Notes (Signed)
Subjective:  Patient ID: Michelle Parker, female    DOB: 1995/09/15  Age: 22 y.o. MRN: [id number]  CC: URI   HPI ELLIOTTE MARSALIS presents for evaluation of a 2-day history with headache, nasal congestion, postnasal drip, sore throat, cough and myalgias.  She had a history of asthma as a child but has not been bothered with it since.  She does not smoke.  She did have a flu shot this year.  She she is taking DayQuil for this.  For this.  Outpatient Medications Prior to Visit  Medication Sig Dispense Refill  . drospirenone-ethinyl estradiol (YAZ) 3-0.02 MG tablet Take 1 tablet by mouth daily. 1 Package 11   No facility-administered medications prior to visit.     ROS Review of Systems  Constitutional: Positive for fatigue. Negative for chills and fever.  HENT: Positive for congestion, postnasal drip, rhinorrhea and sore throat. Negative for trouble swallowing.   Eyes: Negative for photophobia and visual disturbance.  Respiratory: Positive for cough. Negative for chest tightness, shortness of breath and wheezing.   Cardiovascular: Negative.   Gastrointestinal: Negative.   Musculoskeletal: Positive for myalgias. Negative for arthralgias.  Skin: Negative for rash.  Allergic/Immunologic: Negative for immunocompromised state.  Hematological: Does not bruise/bleed easily.  Psychiatric/Behavioral: Negative.     Objective:  BP 118/72 (BP Location: Right Arm, Patient Position: Sitting, Cuff Size: Normal)   Pulse (!) 103   Temp 98.8 F (37.1 C) (Oral)   Ht 5' 6.25" (1.683 m)   Wt 193 lb 4 oz (87.7 kg)   SpO2 100%   BMI 30.96 kg/m   BP Readings from Last 3 Encounters:  06/04/17 118/72  12/31/16 104/82  12/27/15 114/66    Wt Readings from Last 3 Encounters:  06/04/17 193 lb 4 oz (87.7 kg)  12/31/16 189 lb 12.8 oz (86.1 kg)  12/27/15 200 lb 4 oz (90.8 kg)    Physical Exam  Constitutional: She is oriented to person, place, and time. She appears well-developed and well-nourished.  No distress.  HENT:  Head: Normocephalic and atraumatic.  Right Ear: External ear normal.  Left Ear: External ear normal.  Mouth/Throat: Oropharynx is clear and moist. No oropharyngeal exudate.  Eyes: Conjunctivae are normal. Pupils are equal, round, and reactive to light. Right eye exhibits no discharge. Left eye exhibits no discharge. No scleral icterus.  Neck: Neck supple. No JVD present. No tracheal deviation present. No thyromegaly present.  Cardiovascular: Normal rate, regular rhythm and normal heart sounds.  Pulmonary/Chest: Effort normal and breath sounds normal. No stridor. No respiratory distress. She has no wheezes. She has no rales.  Lymphadenopathy:    She has no cervical adenopathy.  Neurological: She is alert and oriented to person, place, and time.  Skin: Skin is warm and dry. She is not diaphoretic.  Psychiatric: She has a normal mood and affect. Her behavior is normal.    Lab Results  Component Value Date   WBC 5.8 12/31/2016   HGB 13.9 12/31/2016   HCT 41.0 12/31/2016   PLT 278.0 12/31/2016   GLUCOSE 83 12/31/2016   CHOL 203 (H) 12/31/2016   TRIG 92.0 12/31/2016   HDL 44.10 12/31/2016   LDLCALC 141 (H) 12/31/2016   ALT 13 12/31/2016   AST 12 12/31/2016   NA 138 12/31/2016   K 4.1 12/31/2016   CL 102 12/31/2016   CREATININE 0.71 12/31/2016   BUN 8 12/31/2016   CO2 26 12/31/2016   TSH 2.42 12/31/2016    No results  found.  Assessment & Plan:   Navi was seen today for uri.  Diagnoses and all orders for this visit:  Viral syndrome   I am having Romero Belling. [place] maintain her drospirenone-ethinyl estradiol.  No orders of the defined types were placed in this encounter.  Discussed multiple over-the-counter medicines she can try.  She is to return with any wheezing or if this is not completely resolved within a week  Follow-up: No Follow-up on file.  Libby Maw, MD

## 2018-08-11 ENCOUNTER — Telehealth: Payer: Self-pay | Admitting: General Practice

## 2018-08-11 NOTE — Telephone Encounter (Signed)
Called and talked to patient and she states that she has stated seeing a  new doctor.

## 2018-08-11 NOTE — Telephone Encounter (Signed)
Called and talked with patient ans she states that she is seeing anew d
